# Patient Record
Sex: Male | Born: 1990 | Race: White | Hispanic: No | Marital: Married | Smoking: Never smoker
Health system: Southern US, Community
[De-identification: ages and names within clinical notes are randomized; demographics above are authoritative.]

## PROBLEM LIST (undated history)

## (undated) DIAGNOSIS — K589 Irritable bowel syndrome without diarrhea: Secondary | ICD-10-CM

---

## 2019-02-12 ENCOUNTER — Encounter (HOSPITAL_COMMUNITY): Payer: Self-pay | Admitting: *Deleted

## 2019-02-12 ENCOUNTER — Emergency Department (HOSPITAL_COMMUNITY): Payer: Managed Care, Other (non HMO)

## 2019-02-12 ENCOUNTER — Emergency Department (HOSPITAL_COMMUNITY)
Admission: EM | Admit: 2019-02-12 | Discharge: 2019-02-12 | Disposition: A | Discharge status: Home / Self Care | Payer: Managed Care, Other (non HMO) | Attending: Emergency Medicine | Admitting: Emergency Medicine

## 2019-02-12 DIAGNOSIS — R0789 Other chest pain: Secondary | ICD-10-CM | POA: Diagnosis not present

## 2019-02-12 DIAGNOSIS — R419 Unspecified symptoms and signs involving cognitive functions and awareness: Secondary | ICD-10-CM | POA: Diagnosis not present

## 2019-02-12 DIAGNOSIS — E876 Hypokalemia: Secondary | ICD-10-CM | POA: Diagnosis not present

## 2019-02-12 DIAGNOSIS — R42 Dizziness and giddiness: Secondary | ICD-10-CM | POA: Insufficient documentation

## 2019-02-12 DIAGNOSIS — R5383 Other fatigue: Secondary | ICD-10-CM | POA: Diagnosis not present

## 2019-02-12 HISTORY — DX: Irritable bowel syndrome without diarrhea: K58.9

## 2019-02-12 LAB — BASIC METABOLIC PANEL
Anion gap: 12 (ref 5–15)
BUN: 15 mg/dL (ref 6–20)
CO2: 24 mmol/L (ref 22–32)
Calcium: 8.9 mg/dL (ref 8.9–10.3)
Chloride: 104 mmol/L (ref 98–111)
Creatinine, Ser: 1.07 mg/dL (ref 0.61–1.24)
GFR calc Af Amer: >60 mL/min (ref >60)
GFR calc non Af Amer: >60 mL/min (ref >60)
Glucose, Bld: 100 mg/dL — ABNORMAL HIGH (ref 70–99)
Potassium: 3 mmol/L — ABNORMAL LOW (ref 3.5–5.1)
Sodium: 140 mmol/L (ref 135–145)

## 2019-02-12 LAB — TROPONIN I: Troponin I: <0.03 ng/mL (ref <0.03)

## 2019-02-12 LAB — CBC
HCT: 40.7 % (ref 39.0–52.0)
Hemoglobin: 14.1 g/dL (ref 13.0–17.0)
MCH: 30.5 pg (ref 26.0–34.0)
MCHC: 34.6 g/dL (ref 30.0–36.0)
MCV: 87.9 fL (ref 80.0–100.0)
Platelets: 192 10*3/uL (ref 150–400)
RBC: 4.63 MIL/uL (ref 4.22–5.81)
RDW: 12.4 % (ref 11.5–15.5)
WBC: 9.9 10*3/uL (ref 4.0–10.5)
nRBC: 0 % (ref 0.0–0.2)

## 2019-02-12 MED ORDER — SODIUM CHLORIDE 0.9% FLUSH: 3.0000 mL | Freq: Once | INTRAVENOUS | Status: DC

## 2019-02-12 MED ORDER — POTASSIUM CHLORIDE CRYS ER 20 MEQ PO TBCR: 40.0000 meq | EXTENDED_RELEASE_TABLET | Freq: Every day | ORAL | 0 refills | Status: AC

## 2019-02-12 MED ORDER — POTASSIUM CHLORIDE CRYS ER 20 MEQ PO TBCR: 40.0000 meq | EXTENDED_RELEASE_TABLET | Freq: Once | ORAL | Status: DC

## 2019-02-12 NOTE — ED Provider Notes (Signed)
[facility]   CSN: [id number] Arrival date & time: 02/12/19  1416    History   Chief Complaint Chief Complaint  Patient presents with  . Chest Pain    HPI Arthur Curry is a 28 y.o. male who presents with chest pain.  Past medical history significant for irritable bowel syndrome.  He states that for the past week he has been having some intermittent substernal chest tightness.  It comes and goes randomly.  It lasts for a couple minutes and will resolve.  He reports associated fatigue and some lightheadedness and anxiety with the chest tightness.  He denies fever, chills, cough, shortness of breath, abdominal pain, nausea, vomiting, swelling or calf pain. No recent surgery/travel/immobilization, hx of cancer, leg swelling, hemoptysis, prior DVT/PE, or hormone use.  He works outside as a Administratorlandscaper and states that he will frequently go throughout the day without drinking fluids.  He is never had this before.  He primarily wanted to get checked out today because he was already here with another family member.  He denies smoking or drug use.  Currently he is asymptomatic.      HPI  Past Medical History:  Diagnosis Date  . IBS (irritable bowel syndrome)     There are no active problems to display for this patient.   History reviewed. No pertinent surgical history.      Home Medications    Prior to Admission medications   Not on File    Family History History reviewed. No pertinent family history.  Social History Social History   Tobacco Use  . Smoking status: Never Smoker  . Smokeless tobacco: Never Used  Substance Use Topics  . Alcohol use: Not on file  . Drug use: Not on file     Allergies   Latex   Review of Systems Review of Systems  Constitutional: Positive for fatigue. Negative for fever.  Respiratory: Negative for cough and shortness of breath.   Cardiovascular: Positive for chest pain. Negative  for palpitations and leg swelling.  Gastrointestinal: Negative for abdominal pain, nausea and vomiting.  Musculoskeletal: Positive for myalgias.  Neurological: Positive for light-headedness.     Physical Exam Updated Vital Signs BP (!) 151/85 (BP Location: Right Arm)   Pulse 68   Temp 98.5 F (36.9 C) (Oral)   Resp 20   SpO2 100%   Physical Exam   ED Treatments / Results  Labs (all labs ordered are listed, but only abnormal results are displayed) Labs Reviewed  BASIC METABOLIC PANEL - Abnormal; Notable for the following components:      Result Value   Potassium 3.0 (*)    Glucose, Bld 100 (*)    All other components within normal limits  CBC  TROPONIN I    EKG EKG Interpretation  Date/Time:  Monday Feb 12 2019 14:24:59 EDT Ventricular Rate:  69 PR Interval:  152 QRS Duration: 96 QT Interval:  348 QTC Calculation: 372 R Axis:   49 Text Interpretation:  Normal sinus rhythm with sinus arrhythmia Normal ECG No prior ECG for comparison.  No STEMI Confirmed by Theda Belfastegeler, Chris ([id number]) on 02/12/2019 4:36:22 PM   Radiology Dg Chest 2 View  Result Date: 02/12/2019 CLINICAL DATA:  Chest pain and shortness of breath EXAM: CHEST - 2 VIEW COMPARISON:  None. FINDINGS: Lungs are clear. Heart size and pulmonary vascularity are normal. No adenopathy. No pneumothorax. No bone lesions. IMPRESSION: No edema or consolidation. Electronically Signed  By: Bretta Bang III M.D.   On: 02/12/2019 14:54    Procedures Procedures (including critical care time)  Medications Ordered in ED Medications  sodium chloride flush (NS) 0.9 % injection 3 mL (has no administration in time range)     Initial Impression / Assessment and Plan / ED Course  I have reviewed the triage vital signs and the nursing notes.  Pertinent labs & imaging results that were available during my care of the patient were reviewed by me and considered in my medical decision making (see chart for details).  28  year old with chest tightness for one week. Chest pain work up is reassuring. He is mildly hypertensive but other vitals are normal. Doubt ACS, PE, pericarditis, esophageal rupture, tension pneumothorax, aortic dissection, cardiac tamponade. CP sounds very atypical. EKG is sinus rhythm with sinus arhythmia. CXR is negative. Troponin is 0. Will not repeat as this sounds non-cardiac and has been going on for a week. CBC is normal. BMP shows hypokalemia (3.0). He was given PO K here and rx for potassium for home. Suspect symptoms are related to this and he is having some muscle spasms vs anxiety. Patient is non-smoker and denies illegal drug use. He is PERC negative. Will d/c with PCP f/u.  ZATHAN BERTH was evaluated in Emergency Department on 02/12/2019 for the symptoms described in the history of present illness. He was evaluated in the context of the global COVID-19 pandemic, which necessitated consideration that the patient might be at risk for infection with the SARS-CoV-2 virus that causes COVID-19. Institutional protocols and algorithms that pertain to the evaluation of patients at risk for COVID-19 are in a state of rapid change based on information released by regulatory bodies including the CDC and federal and state organizations. These policies and algorithms were followed during the patient's care in the ED.   Final Clinical Impressions(s) / ED Diagnoses   Final diagnoses:  Atypical chest pain  Hypokalemia    ED Discharge Orders    None       Bethel Born, PA-C 02/12/19 1738    Tegeler, Canary Brim, MD 02/13/19 [phone]

## 2019-02-12 NOTE — Discharge Instructions (Addendum)
Your blood work today showed that your potassium was low. Please take the potassium pills prescribed for the next couple of days Please follow up with your doctor to have your blood pressure rechecked since it was high today Drink plenty of water and something like gatorade/powerade/pedialyte if you are outside for long periods of time Return if you are worsening

## 2019-02-12 NOTE — ED Notes (Signed)
Patient ambulatory to room from waiting area with steady gait. NAD noted.

## 2019-02-12 NOTE — ED Notes (Signed)
Patient verbalizes understanding of discharge instructions. Opportunity for questioning and answers were provided. Armband removed by staff, pt discharged from ED.  

## 2019-02-12 NOTE — ED Triage Notes (Signed)
Pt in c/o chest pain for the last week, pain is intermittent, denies cough or shortness of breath, no distress noted

## 2020-11-06 IMAGING — CR CHEST - 2 VIEW
2 series · 2 of 2 positions shown · non-contrast
Comparison: None.

CLINICAL DATA: Chest pain and shortness of breath

EXAM:
CHEST - 2 VIEW

[chest pa]
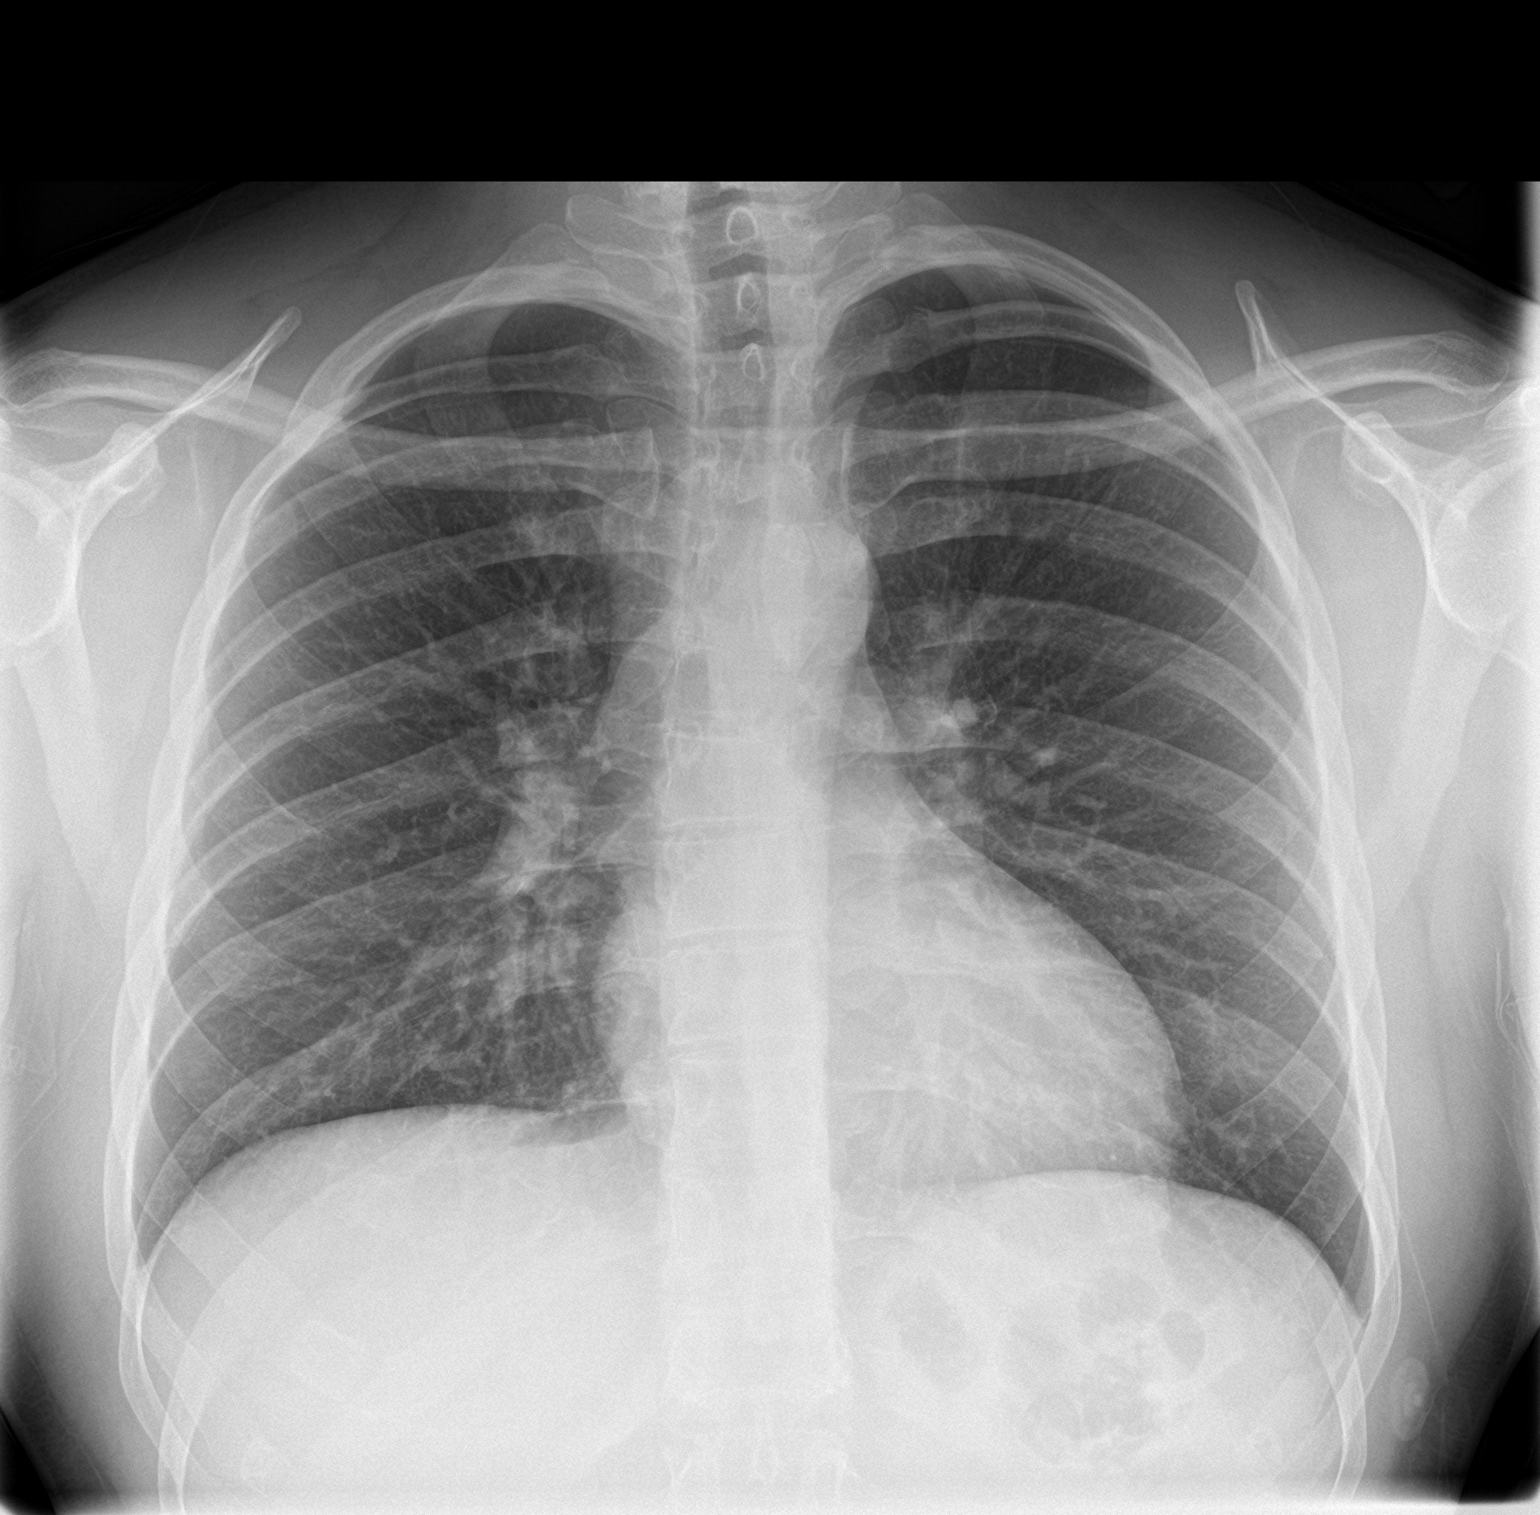

[chest lat]
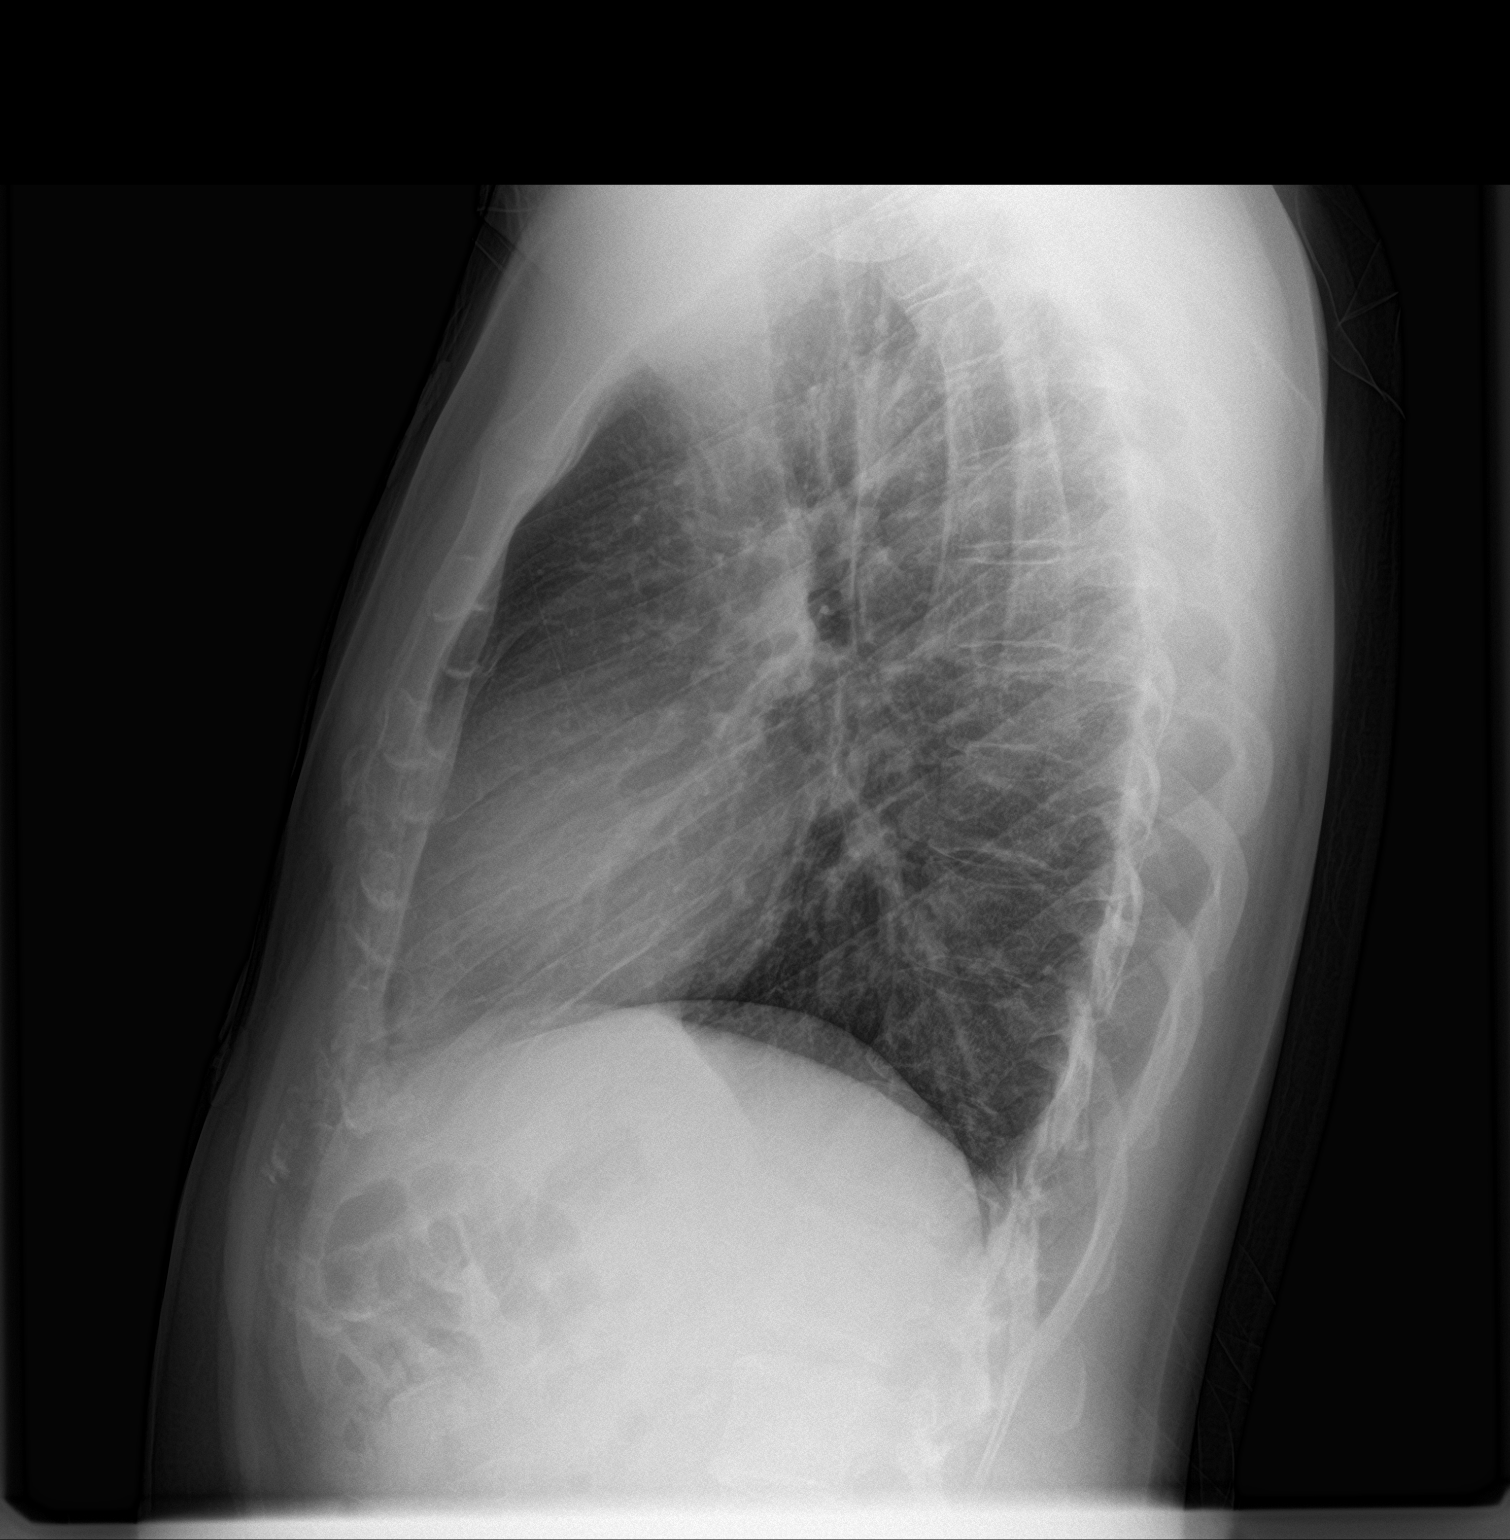

[2 of 2 positions shown; findings below may reference images not displayed]

FINDINGS: Lungs are clear. Heart size and pulmonary vascularity are normal. No
adenopathy. No pneumothorax. No bone lesions.
IMPRESSION: No edema or consolidation.

## 2021-04-18 ENCOUNTER — Other Ambulatory Visit: Payer: Self-pay

## 2021-04-18 ENCOUNTER — Encounter (HOSPITAL_BASED_OUTPATIENT_CLINIC_OR_DEPARTMENT_OTHER): Payer: Self-pay | Admitting: Emergency Medicine

## 2021-04-18 ENCOUNTER — Emergency Department (HOSPITAL_BASED_OUTPATIENT_CLINIC_OR_DEPARTMENT_OTHER)
Admission: EM | Admit: 2021-04-18 | Discharge: 2021-04-18 | Disposition: A | Discharge status: Home / Self Care | Payer: BC Managed Care – PPO | Attending: Emergency Medicine | Admitting: Emergency Medicine

## 2021-04-18 DIAGNOSIS — F419 Anxiety disorder, unspecified: Secondary | ICD-10-CM | POA: Diagnosis not present

## 2021-04-18 DIAGNOSIS — I10 Essential (primary) hypertension: Secondary | ICD-10-CM | POA: Diagnosis not present

## 2021-04-18 DIAGNOSIS — Z91040 Latex allergy status: Secondary | ICD-10-CM | POA: Diagnosis not present

## 2021-04-18 DIAGNOSIS — R0602 Shortness of breath: Secondary | ICD-10-CM | POA: Insufficient documentation

## 2021-04-18 DIAGNOSIS — R110 Nausea: Secondary | ICD-10-CM | POA: Insufficient documentation

## 2021-04-18 DIAGNOSIS — R079 Chest pain, unspecified: Secondary | ICD-10-CM

## 2021-04-18 NOTE — ED Provider Notes (Signed)
[facility] EMERGENCY DEPARTMENT Provider Note   CSN: [id number] Arrival date & time: 04/18/21  0719     History Chief Complaint  Patient presents with   Chest Pain    Arthur Curry is a 30 y.o. male.  Arthur Curry is with chest pain that has been ongoing for greater than 24 hours.  He was seen at [facility], and he waited there for almost 12 hours.  He did have serial troponins which were normal as well as a chest x-ray which was normal.  He had a similar episode to this several years ago and was diagnosed with hypokalemia.   The history is provided by the patient.  Chest Pain Pain location:  L chest Pain quality: sharp   Radiates to: right chest. Pain severity:  Mild Onset quality:  Gradual Duration:  27 hours Timing:  Constant Progression:  Unchanged Chronicity:  New Context: at rest   Relieved by:  Nothing Worsened by:  Deep breathing Ineffective treatments:  None tried Associated symptoms: anxiety, nausea and shortness of breath   Associated symptoms: no abdominal pain, no back pain, no cough, no fever, no palpitations and no vomiting   Risk factors: no coronary artery disease and no smoking       Past Medical History:  Diagnosis Date   IBS (irritable bowel syndrome)     There are no problems to display for this patient.   History reviewed. No pertinent surgical history.     No family history on file.  Social History   Tobacco Use   Smoking status: Never   Smokeless tobacco: Never    Home Medications Prior to Admission medications   Medication Sig Start Date End Date Taking? Authorizing Provider  potassium chloride SA (K-DUR) 20 MEQ tablet Take 2 tablets (40 mEq total) by mouth daily. 02/12/19   Bethel Born, PA-C    Allergies    Latex  Review of Systems   Review of Systems  Constitutional:  Negative for chills and fever.  HENT:  Negative for ear pain and sore throat.   Eyes:  Negative for pain and visual  disturbance.  Respiratory:  Positive for shortness of breath. Negative for cough.   Cardiovascular:  Positive for chest pain. Negative for palpitations.  Gastrointestinal:  Positive for nausea. Negative for abdominal pain and vomiting.  Genitourinary:  Negative for dysuria and hematuria.  Musculoskeletal:  Negative for arthralgias and back pain.  Skin:  Negative for color change and rash.  Neurological:  Negative for seizures and syncope.  All other systems reviewed and are negative.  Physical Exam Updated Vital Signs BP (!) 150/104 (BP Location: Right Arm)   Pulse 63   Temp 98 F (36.7 C) (Oral)   Resp 18   SpO2 100%   Physical Exam Vitals and nursing note reviewed.  Constitutional:      Appearance: Normal appearance.  HENT:     Head: Normocephalic and atraumatic.  Cardiovascular:     Rate and Rhythm: Normal rate and regular rhythm.     Heart sounds: Normal heart sounds.  Pulmonary:     Effort: Pulmonary effort is normal.     Breath sounds: Normal breath sounds.  Abdominal:     General: There is no distension.     Tenderness: There is no abdominal tenderness. There is no guarding.  Musculoskeletal:     Cervical back: Normal range of motion.     Right lower leg: No edema.  Left lower leg: No edema.  Skin:    General: Skin is warm and dry.  Neurological:     General: No focal deficit present.     Mental Status: He is alert and oriented to person, place, and time.  Psychiatric:        Mood and Affect: Mood normal.        Behavior: Behavior normal.    ED Results / Procedures / Treatments   Labs (all labs ordered are listed, but only abnormal results are displayed) Labs Reviewed - No data to display  EKG EKG Interpretation  Date/Time:  Saturday April 18 2021 07:29:34 EDT Ventricular Rate:  71 PR Interval:  154 QRS Duration: 101 QT Interval:  378 QTC Calculation: 411 R Axis:   62 Text Interpretation: Sinus rhythm ST elev, probable normal early repol pattern  normal axis No acute ischemia Confirmed by Pieter Partridge ([id number]) on 04/18/2021 8:09:09 AM  Radiology No results found.  Procedures Procedures   Medications Ordered in ED Medications - No data to display  ED Course  I have reviewed the triage vital signs and the nursing notes.  Pertinent labs & imaging results that were available during my care of the patient were reviewed by me and considered in my medical decision making (see chart for details).    MDM Rules/Calculators/A&P                           [name] is low risk for cardiac events with a heart score of 0.  He had serial troponins and an x-ray at an outside hospital.  I can review these, and they were completely normal.  He is PERC negative.  No concern for PE.  He is well-appearing with the exception of having a very mild elevation in his blood pressure.  I have referred him to family practice for follow-up.  He will be discharged home. Final Clinical Impression(s) / ED Diagnoses Final diagnoses:  Chest pain, unspecified type  Hypertension, unspecified type    Rx / DC Orders ED Discharge Orders          Ordered    Ambulatory referral to [facility]        04/18/21 0810             Koleen Distance, MD 04/18/21 [phone]

## 2021-04-18 NOTE — ED Triage Notes (Signed)
Pt C/O chest pain that started while at work. Pt reports when he starts "freaking out" it makes the chest pain worse.

## 2023-04-30 ENCOUNTER — Other Ambulatory Visit: Payer: Self-pay

## 2023-04-30 ENCOUNTER — Emergency Department (HOSPITAL_BASED_OUTPATIENT_CLINIC_OR_DEPARTMENT_OTHER): Payer: BC Managed Care – PPO

## 2023-04-30 ENCOUNTER — Encounter (HOSPITAL_BASED_OUTPATIENT_CLINIC_OR_DEPARTMENT_OTHER): Payer: Self-pay

## 2023-04-30 ENCOUNTER — Emergency Department (HOSPITAL_BASED_OUTPATIENT_CLINIC_OR_DEPARTMENT_OTHER)
Admission: EM | Admit: 2023-04-30 | Discharge: 2023-04-30 | Disposition: A | Discharge status: Home / Self Care | Payer: BC Managed Care – PPO | Attending: Emergency Medicine | Admitting: Emergency Medicine

## 2023-04-30 DIAGNOSIS — N453 Epididymo-orchitis: Secondary | ICD-10-CM | POA: Diagnosis not present

## 2023-04-30 DIAGNOSIS — Z91040 Latex allergy status: Secondary | ICD-10-CM | POA: Insufficient documentation

## 2023-04-30 DIAGNOSIS — N5089 Other specified disorders of the male genital organs: Secondary | ICD-10-CM | POA: Diagnosis present

## 2023-04-30 LAB — URINALYSIS, ROUTINE W REFLEX MICROSCOPIC
Bilirubin Urine: NEGATIVE
Glucose, UA: NEGATIVE mg/dL
Hgb urine dipstick: NEGATIVE
Ketones, ur: 15 mg/dL — AB
Leukocytes,Ua: NEGATIVE
Nitrite: NEGATIVE
Protein, ur: 30 mg/dL — AB
Specific Gravity, Urine: >=1.03 (ref 1.005–1.030)
pH: 5.5 (ref 5.0–8.0)

## 2023-04-30 LAB — URINALYSIS, MICROSCOPIC (REFLEX)

## 2023-04-30 NOTE — ED Provider Notes (Signed)
[facility] [place] EMERGENCY DEPARTMENT AT [facility] Provider Note   CSN: [id number] Arrival date & time: 04/30/23  2035     History  Chief Complaint  Patient presents with   Testicle Pain    Arthur Curry is a 32 y.o. male.  32-year male presents today for concern of testicular trauma that occurred yesterday.  He states he bumped into the edge of a table.  Since then he has had blood-tinged urine and some swelling over his testicle.  Denies any bruising.  No other complaints.  The history is provided by the patient. No language interpreter was used.       Home Medications Prior to Admission medications   Medication Sig Start Date End Date Taking? Authorizing Provider  potassium chloride SA (K-DUR) 20 MEQ tablet Take 2 tablets (40 mEq total) by mouth daily. 02/12/19   Bethel Born, PA-C      Allergies    Latex    Review of Systems   Review of Systems  Constitutional:  Negative for fever.  Genitourinary:  Positive for hematuria and testicular pain. Negative for difficulty urinating, dysuria, penile discharge, penile swelling and scrotal swelling.  All other systems reviewed and are negative.   Physical Exam Updated Vital Signs BP (!) 142/89 (BP Location: Left Arm)   Pulse 96   Temp 98.6 F (37 C) (Oral)   Resp 16   Ht 6' (1.829 m)   Wt 90.7 kg   SpO2 99%   BMI 27.12 kg/m  Physical Exam Vitals and nursing note reviewed.  Constitutional:      General: He is not in acute distress.    Appearance: Normal appearance. He is not ill-appearing.  HENT:     Head: Normocephalic and atraumatic.     Nose: Nose normal.  Eyes:     Conjunctiva/sclera: Conjunctivae normal.  Pulmonary:     Effort: Pulmonary effort is normal. No respiratory distress.  Musculoskeletal:        General: No deformity.  Skin:    Findings: No rash.  Neurological:     Mental Status: He is alert.     ED Results / Procedures / Treatments   Labs (all labs ordered are listed, but  only abnormal results are displayed) Labs Reviewed  URINALYSIS, ROUTINE W REFLEX MICROSCOPIC    EKG None  Radiology US SCROTUM W/DOPPLER  Result Date: 04/30/2023 CLINICAL DATA:  Pain. Left testicular swelling and pain for 1 day since hitting scrotum on table at work. The patient reports left testicular lump since the injury. EXAM: SCROTAL ULTRASOUND DOPPLER ULTRASOUND OF THE TESTICLES TECHNIQUE: Complete ultrasound examination of the testicles, epididymis, and other scrotal structures was performed. Color and spectral Doppler ultrasound were also utilized to evaluate blood flow to the testicles. COMPARISON:  None Available. FINDINGS: Right testicle Measurements: 5.5 x 2.9 x 3.1 cm. Mildly heterogeneous parenchymal echogenicity. No mass or microlithiasis visualized. Left testicle Measurements: 5.8 x 2.8 x 3.8 cm. Mildly heterogeneous parenchymal echogenicity. No mass or microlithiasis visualized. Right epididymis: The right epididymal head measures 11 x 8 x 11 mm. Left epididymis: The left epididymal head measures 13 x 11 x 20 mm. There is increased color flow vascularity within left epididymal head and body compared to the right. Hydrocele:  Small left hydrocele. Varicocele:  None visualized. Pulsed Doppler interrogation of the right testicle demonstrates normal low resistance arterial and venous waveforms bilaterally. Normal right testicle color flow vascularity. Pulsed Doppler interrogation of the left testicle demonstrates normal low resistance  arterial and venous waveforms bilaterally. There is markedly increased left testicular color flow vascularity indicating hyperemia. The patient reports a palpable lump superior to left testicle in the spermatic cord region. Additional images of this region demonstrate hyperemia likely around the spermatic cord. IMPRESSION: 1. Marked left testicular hyperemia. There is also hyperemia of the left epididymal head and body compared to the right. Findings are  compatible with left epididymoorchitis. Some of the hypervascularity could be due to a soft tissue injury given the patient reports trauma to this region yesterday. 2. The patient reports a palpable lump superior to left testicle in the spermatic cord region. Dedicated images of this region demonstrate hyperemia likely around the spermatic cord. 3. Small left hydrocele. Electronically Signed   By: Neita Garnet M.D.   On: 04/30/2023 21:48    Procedures Procedures    Medications Ordered in ED Medications - No data to display  ED Course/ Medical Decision Making/ A&P Clinical Course as of 04/30/23 2219  Sat Apr 30, 2023  2206 US SCROTUM W/DOPPLER [AA]    Clinical Course User Index [AA] Marita Kansas, PA-C                                 Medical Decision Making Amount and/or Complexity of Data Reviewed Labs: ordered. Radiology: ordered. Decision-making details documented in ED Course.   32 year old male presents today for testicular trauma.  Occurred yesterday.  Since then has blood-tinged urine.  Ultrasound obtained.  Showed epididymoorchitis.  Normal blood flow.  He is able to urinate without any difficulty.  This is from trauma as opposed chest.  Symptomatic manage with cold compress, and anti-inflammatory medication discussed.  Urology referral given.   Final Clinical Impression(s) / ED Diagnoses Final diagnoses:  Orchitis and epididymitis    Rx / DC Orders ED Discharge Orders     None         Marita Kansas, PA-C 04/30/23 2314    Virgina Norfolk, DO 04/30/23 2317

## 2023-04-30 NOTE — ED Triage Notes (Signed)
Patient stated he hit his testicle yesterday at work Now he has a lump and pain on his testicle. He stated he also thinks there is blood when he urinates.

## 2023-04-30 NOTE — ED Notes (Signed)
Pt. Reports hitting his scrotum on a table yesterday and today he has had pain and swelling in the area.  Pt. Reports feeling  a lump thruout the area of the penis he hit.  No blood in his urine per the pt.

## 2023-04-30 NOTE — Discharge Instructions (Signed)
Your ultrasound showed inflammation.  No concerning findings.  Take ibuprofen 600 mg 3 times daily for the next 7 days.  Ice the area for 10 minutes 4-5 times a day.  I listed the neurology information above.  Follow-up with them.  For any concerning symptoms return to the emergency department.
